# Patient Record
Sex: Male | Born: 1995 | Race: White | Hispanic: No | Marital: Single | Smoking: Current every day smoker
Health system: Southern US, Community
[De-identification: ages and names within clinical notes are randomized; demographics above are authoritative.]

## PROBLEM LIST (undated history)

## (undated) DIAGNOSIS — F329 Major depressive disorder, single episode, unspecified: Secondary | ICD-10-CM

## (undated) DIAGNOSIS — F32A Depression, unspecified: Secondary | ICD-10-CM

## (undated) DIAGNOSIS — F419 Anxiety disorder, unspecified: Secondary | ICD-10-CM

## (undated) DIAGNOSIS — G8929 Other chronic pain: Secondary | ICD-10-CM

## (undated) DIAGNOSIS — M549 Dorsalgia, unspecified: Secondary | ICD-10-CM

## (undated) HISTORY — PX: SURGERY SCROTAL / TESTICULAR: SUR1316

---
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
## 2019-12-20 ENCOUNTER — Other Ambulatory Visit: Payer: Self-pay

## 2019-12-20 ENCOUNTER — Emergency Department (HOSPITAL_COMMUNITY)
Admission: EM | Admit: 2019-12-20 | Discharge: 2019-12-20 | Disposition: A | Discharge status: Home / Self Care | Payer: BC Managed Care – PPO | Attending: Emergency Medicine | Admitting: Emergency Medicine

## 2019-12-20 ENCOUNTER — Encounter (HOSPITAL_COMMUNITY): Payer: Self-pay

## 2019-12-20 ENCOUNTER — Emergency Department (HOSPITAL_COMMUNITY): Payer: BC Managed Care – PPO

## 2019-12-20 DIAGNOSIS — N5089 Other specified disorders of the male genital organs: Secondary | ICD-10-CM

## 2019-12-20 DIAGNOSIS — T7840XA Allergy, unspecified, initial encounter: Secondary | ICD-10-CM | POA: Diagnosis not present

## 2019-12-20 HISTORY — DX: Depression, unspecified: F32.A

## 2019-12-20 HISTORY — DX: Anxiety disorder, unspecified: F41.9

## 2019-12-20 HISTORY — DX: Other chronic pain: G89.29

## 2019-12-20 HISTORY — DX: Dorsalgia, unspecified: M54.9

## 2019-12-20 MED ORDER — DIPHENHYDRAMINE HCL 25 MG PO CAPS
25.0000 mg | ORAL_CAPSULE | Freq: Once | ORAL | Status: AC
  Administered 2019-12-20: 07:00:00 25 mg via ORAL
  Filled 2019-12-20: qty 1

## 2019-12-20 MED ORDER — LORAZEPAM 2 MG/ML IJ SOLN
1.0000 mg | Freq: Once | INTRAMUSCULAR | Status: AC
  Administered 2019-12-20: 08:00:00 1 mg via INTRAVENOUS
  Filled 2019-12-20: qty 1

## 2019-12-20 NOTE — Discharge Instructions (Addendum)
It appears that the swelling and irritation of your scrotum is an allergic response.  For this, use Benadryl 25 mg 4 times a day.  Do not drive or work when taking Benadryl as it can make you sleepy.  Additional care would include daily cleansing with soap and water, and avoiding putting any creams or lotion on the penis or scrotum.  Return here if needed for persistent or worsening symptoms.

## 2019-12-20 NOTE — ED Provider Notes (Signed)
9:20 AM-follow-up after ultrasound imaging.  Ultrasound does show abscess or infection.  It does show scrotal thickening which is consistent with a physical exam.  At this time genital exam, by me, with mild scrotal edema bilaterally.  There is a small abrasion on the ventral surface of the penis.  There is no associated drainage or bleeding or discharge from the penis.  Examination consistent with induration that is most likely inflammatory/allergic, with small abrasion of penis.  No sign for bacterial infection or abscess.  Treatment will be symptomatic at this point with Benadryl, 4 times daily, for at least 2 days.  Caution for driving and working given.   Mancel Bale, MD 12/20/19 [phone]

## 2019-12-20 NOTE — ED Triage Notes (Signed)
Pt states he noticed his scrotum started swelling yesterday. Tried to sleep it off but it was still swollen. Denies any injury.

## 2019-12-20 NOTE — ED Provider Notes (Signed)
[facility] Provider Note   CSN: [id number] Arrival date & time: 12/20/19  2952   History Chief Complaint  Patient presents with  . Groin Swelling    Brent Foster is a 24 y.o. male.  The history is provided by the patient.  He has history of anxiety and comes in complaining of scrotal swelling.  Yesterday, he noted some itching of his scrotum and he did scratch.  He noted some swelling of the scrotal wall which is bilateral.  He continues to have an itching and burning sensation in the scrotum.  He denies any penile swelling.  He denies any unusual exposures.  Past Medical History:  Diagnosis Date  . Anxiety   . Back pain   . Chronic headaches   . Depression     There are no problems to display for this patient.   History reviewed. No pertinent surgical history.     History reviewed. No pertinent family history.  Social History   Tobacco Use  . Smoking status: Never Smoker  . Smokeless tobacco: Never Used  Substance Use Topics  . Alcohol use: Not Currently  . Drug use: Not Currently    Home Medications Prior to Admission medications   Not on File    Allergies    Patient has no active allergies.  Review of Systems   Review of Systems  All other systems reviewed and are negative.   Physical Exam Updated Vital Signs BP 122/69 (BP Location: Right Arm)   Pulse (!) 57   Ht 6\' 1"  (1.854 m)   Wt 63.5 kg   SpO2 99%   BMI 18.47 kg/m   Physical Exam Vitals and nursing note reviewed.   24 year old male, resting comfortably and in no acute distress. Vital signs are normal. Oxygen saturation is 99%, which is normal. Head is normocephalic and atraumatic. PERRLA, EOMI. Oropharynx is clear. Neck is nontender and supple without adenopathy or JVD. Back is nontender and there is no CVA tenderness. Lungs are clear without rales, wheezes, or rhonchi. Chest is nontender. Heart has regular rate and rhythm without murmur. Abdomen is soft,  flat, nontender without masses or hepatosplenomegaly and peristalsis is normoactive. Genitalia: Circumcised penis.  Testes descended.  Moderate induration of the scrotal wall diffusely without any overlying erythema and no fluctuance.  Moderate bilateral inguinal adenopathy present. Extremities have no cyanosis or edema, full range of motion is present. Skin is warm and dry without rash. Neurologic: Mental status is normal, cranial nerves are intact, there are no motor or sensory deficits.  ED Results / Procedures / Treatments    Radiology No results found.  Procedures Procedures  Medications Ordered in ED Medications  diphenhydrAMINE (BENADRYL) capsule 25 mg (has no administration in time range)    ED Course  I have reviewed the triage vital signs and the nursing notes.  Pertinent imaging results that were available during my care of the patient were reviewed by me and considered in my medical decision making (see chart for details).  MDM Rules/Calculators/A&P Swelling of the scrotal wall.  This is most likely allergic, but could be early abscess formation.  He will be sent for scrotal ultrasound.  Old records are reviewed, and he has no relevant past visits.  Case is signed out to Dr. Eulis Foster.  Final Clinical Impression(s) / ED Diagnoses Final diagnoses:  Scrotal swelling    Rx / DC Orders ED Discharge Orders    None       Roxanne Mins,  Onalee Hua, MD 12/20/19 [phone]

## 2020-02-14 ENCOUNTER — Emergency Department (HOSPITAL_COMMUNITY)
Admission: EM | Admit: 2020-02-14 | Discharge: 2020-02-14 | Disposition: A | Discharge status: Home / Self Care | Payer: BC Managed Care – PPO | Attending: Emergency Medicine | Admitting: Emergency Medicine

## 2020-02-14 ENCOUNTER — Other Ambulatory Visit: Payer: Self-pay

## 2020-02-14 DIAGNOSIS — X19XXXA Contact with other heat and hot substances, initial encounter: Secondary | ICD-10-CM | POA: Insufficient documentation

## 2020-02-14 DIAGNOSIS — Y9389 Activity, other specified: Secondary | ICD-10-CM | POA: Diagnosis not present

## 2020-02-14 DIAGNOSIS — Y999 Unspecified external cause status: Secondary | ICD-10-CM | POA: Diagnosis not present

## 2020-02-14 DIAGNOSIS — Y929 Unspecified place or not applicable: Secondary | ICD-10-CM | POA: Insufficient documentation

## 2020-02-14 DIAGNOSIS — T23212A Burn of second degree of left thumb (nail), initial encounter: Secondary | ICD-10-CM | POA: Insufficient documentation

## 2020-02-14 MED ORDER — HYDROCODONE-ACETAMINOPHEN 5-325 MG PO TABS: 1.0000 | ORAL_TABLET | ORAL | 0 refills | Status: AC | PRN

## 2020-02-14 MED ORDER — IBUPROFEN 400 MG PO TABS
400.0000 mg | ORAL_TABLET | Freq: Once | ORAL | Status: AC
  Administered 2020-02-14: 01:00:00 400 mg via ORAL
  Filled 2020-02-14: qty 1

## 2020-02-14 MED ORDER — SILVER SULFADIAZINE 1 % EX CREA
TOPICAL_CREAM | Freq: Once | CUTANEOUS | Status: AC
  Filled 2020-02-14: qty 50
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
## 2020-02-14 NOTE — Discharge Instructions (Signed)
Chang dressing once a day.  Take ibuprofen as needed for pain.  Return if you are having any problems.

## 2020-02-15 MED FILL — Hydrocodone-Acetaminophen Tab 5-325 MG: ORAL | Qty: 6 | Status: AC

## 2020-02-28 ENCOUNTER — Ambulatory Visit
Admission: RE | Admit: 2020-02-28 | Discharge: 2020-02-28 | Disposition: A | Discharge status: Home / Self Care | Payer: BC Managed Care – PPO | Source: Ambulatory Visit | Attending: Emergency Medicine | Admitting: Emergency Medicine

## 2020-02-28 ENCOUNTER — Ambulatory Visit
Admission: EM | Admit: 2020-02-28 | Discharge: 2020-02-28 | Disposition: A | Discharge status: Home / Self Care | Payer: BC Managed Care – PPO | Attending: Emergency Medicine | Admitting: Emergency Medicine

## 2020-02-28 ENCOUNTER — Other Ambulatory Visit: Payer: Self-pay

## 2020-02-28 DIAGNOSIS — N503 Cyst of epididymis: Secondary | ICD-10-CM

## 2020-02-28 DIAGNOSIS — N433 Hydrocele, unspecified: Secondary | ICD-10-CM | POA: Insufficient documentation

## 2020-02-28 DIAGNOSIS — N50811 Right testicular pain: Secondary | ICD-10-CM

## 2020-02-28 NOTE — ED Triage Notes (Signed)
Patient states that he has a lump on his right testicle that has been burning and is like a dull pain. States that he was seen by the ED for this around 2 months and told him it was an allergic reaction to a soap, had a negative [place] at that time.

## 2020-02-28 NOTE — Discharge Instructions (Signed)
Go immediately to the hospital for the ultrasound of your testicle.  Wait there for the results.    Follow-up with a urologist.  Call to schedule an appointment.

## 2020-02-28 NOTE — ED Provider Notes (Signed)
[facility]    CSN: [id number] Arrival date & time: 02/28/20  0941      History   Chief Complaint Chief Complaint  Patient presents with  . Testicle Pain    HPI Brent Foster is a 24 y.o. male.   Patient presents with right testicular painful "lump" and scrotal swelling x 2 months.  He states the pain is "burning, dull" and has increased since last night; he reports no increase in size of the "lump".  He was seen in the ED for these symptoms on 12/20/2019; ultrasound showed "thickened edematous scrotal wall, normal appearing testicles with normal perfusion, benign 1.7 cm pole cyst of the right epididymis, small left hydrocele".  Patient  states he has a history of testicular torsion requiring surgery 4 years ago.  He denies fever, chills, abdominal pain, penile discharge, dysuria, back pain, or other symptoms.  The history is provided by the patient.    Past Medical History:  Diagnosis Date  . Anxiety   . Back pain   . Chronic headaches   . Depression     There are no problems to display for this patient.   History reviewed. No pertinent surgical history.     Home Medications    Prior to Admission medications   Medication Sig Start Date End Date Taking? Authorizing Provider  HYDROcodone-acetaminophen (NORCO) 5-325 MG tablet Take 1 tablet by mouth every 4 (four) hours as needed for moderate pain. 02/14/20   Dione Booze, MD    Family History Family History  Problem Relation Age of Onset  . Healthy Mother   . Healthy Father     Social History Social History   Tobacco Use  . Smoking status: Current Every Day Smoker    Packs/day: 1.00    Types: Cigarettes  . Smokeless tobacco: Never Used  Substance Use Topics  . Alcohol use: Yes    Comment: rare  . Drug use: Yes    Types: Marijuana     Allergies   Patient has no known allergies.   Review of Systems Review of Systems  Constitutional: Negative for chills and fever.  HENT: Negative for ear  pain and sore throat.   Eyes: Negative for pain and visual disturbance.  Respiratory: Negative for cough and shortness of breath.   Cardiovascular: Negative for chest pain and palpitations.  Gastrointestinal: Negative for abdominal pain and vomiting.  Genitourinary: Positive for scrotal swelling and testicular pain. Negative for dysuria, flank pain, hematuria and penile pain.  Musculoskeletal: Negative for arthralgias and back pain.  Skin: Negative for color change and rash.  Neurological: Negative for seizures and syncope.  All other systems reviewed and are negative.    Physical Exam Triage Vital Signs ED Triage Vitals [02/28/20 0943]  Enc Vitals Group     BP      Pulse      Resp      Temp      Temp src      SpO2      Weight 140 lb (63.5 kg)     Height 6\' 1"  (1.854 m)     Head Circumference      Peak Flow      Pain Score 1     Pain Loc      Pain Edu?      Excl. in GC?    No data found.  Updated Vital Signs BP 123/79 (BP Location: Right Arm)   Pulse 62   Temp 98.3 F (36.8 C) (  Oral)   Resp 18   Ht 6\' 1"  (1.854 m)   Wt 140 lb (63.5 kg)   SpO2 98%   BMI 18.47 kg/m   Visual Acuity Right Eye Distance:   Left Eye Distance:   Bilateral Distance:    Right Eye Near:   Left Eye Near:    Bilateral Near:     Physical Exam Vitals and nursing note reviewed.  Constitutional:      Appearance: He is well-developed.  HENT:     Head: Normocephalic and atraumatic.     Mouth/Throat:     Mouth: Mucous membranes are moist.  Eyes:     Conjunctiva/sclera: Conjunctivae normal.  Cardiovascular:     Rate and Rhythm: Normal rate and regular rhythm.     Heart sounds: No murmur.  Pulmonary:     Effort: Pulmonary effort is normal. No respiratory distress.     Breath sounds: Normal breath sounds.  Abdominal:     Palpations: Abdomen is soft.     Tenderness: There is no abdominal tenderness. There is no right CVA tenderness, left CVA tenderness, guarding or rebound.    Genitourinary:    Penis: Normal and circumcised.      Testes: Normal.     Epididymis:     Right: Mass present.    Musculoskeletal:     Cervical back: Neck supple.  Skin:    General: Skin is warm and dry.  Neurological:     General: No focal deficit present.     Mental Status: He is alert and oriented to person, place, and time.     Gait: Gait normal.  Psychiatric:        Mood and Affect: Mood normal.        Behavior: Behavior normal.      UC Treatments / Results  Labs (all labs ordered are listed, but only abnormal results are displayed) Labs Reviewed - No data to display  EKG   Radiology US SCROTUM W/DOPPLER  Result Date: 02/28/2020 CLINICAL DATA:  Right testicular pain for 2 months EXAM: SCROTAL ULTRASOUND DOPPLER ULTRASOUND OF THE TESTICLES TECHNIQUE: Complete ultrasound examination of the testicles, epididymis, and other scrotal structures was performed. Color and spectral Doppler ultrasound were also utilized to evaluate blood flow to the testicles. COMPARISON:  None. FINDINGS: Right testicle Measurements: 4.4 x 2.4 x 3.2 cm. No mass or microlithiasis visualized. Left testicle Measurements: 4.4 x 1.9 x 2.8 cm. No mass or microlithiasis visualized. Right epididymis: There is a 1.5 cm simple cyst in the right epididymis. Left epididymis:  Normal in size and appearance. Hydrocele:  There is a small left hydrocele. Varicocele:  None visualized. Pulsed Doppler interrogation of both testes demonstrates normal low resistance arterial and venous waveforms bilaterally. IMPRESSION: 1. There is a 1.5 cm simple cyst in the right epididymis. 2. There is a small left hydrocele. 3. No other abnormalities are identified. Electronically Signed   By: Dorise Bullion III M.D   On: 02/28/2020 12:20    Procedures Procedures (including critical care time)  Medications Ordered in UC Medications - No data to display  Initial Impression / Assessment and Plan / UC Course  I have reviewed the  triage vital signs and the nursing notes.  Pertinent labs & imaging results that were available during my care of the patient were reviewed by me and considered in my medical decision making (see chart for details).   Right testicular pain and mass. Ultrasound shows 1.5 cm simple cyst in the right  epididymis, small left hydrocele, no other abnormalities.  Discussed [place] results with patient.  Instructed him to call Urology to schedule an appointment for follow up.  Patient agrees to plan of care.      Final Clinical Impressions(s) / UC Diagnoses   Final diagnoses:  Pain in right testicle  Epididymal cyst     Discharge Instructions     Go immediately to the hospital for the ultrasound of your testicle.  Wait there for the results.    Follow-up with a urologist.  Call to schedule an appointment.        ED Prescriptions    None     PDMP not reviewed this encounter.   Mickie Bail, NP 02/28/20 [phone]

## 2020-10-10 ENCOUNTER — Encounter (HOSPITAL_COMMUNITY): Payer: Self-pay | Admitting: *Deleted

## 2020-10-10 ENCOUNTER — Other Ambulatory Visit: Payer: Self-pay

## 2020-10-10 ENCOUNTER — Emergency Department (HOSPITAL_COMMUNITY)
Admission: EM | Admit: 2020-10-10 | Discharge: 2020-10-10 | Discharge status: Against Medical Advice | Payer: BC Managed Care – PPO | Attending: Emergency Medicine | Admitting: Emergency Medicine

## 2020-10-10 DIAGNOSIS — F32A Depression, unspecified: Secondary | ICD-10-CM | POA: Diagnosis not present

## 2020-10-10 DIAGNOSIS — F17210 Nicotine dependence, cigarettes, uncomplicated: Secondary | ICD-10-CM | POA: Diagnosis not present

## 2020-10-10 DIAGNOSIS — Z20822 Contact with and (suspected) exposure to covid-19: Secondary | ICD-10-CM | POA: Diagnosis not present

## 2020-10-10 LAB — RESP PANEL BY RT-PCR (FLU A&B, COVID) ARPGX2
Influenza A by PCR: NEGATIVE
Influenza B by PCR: NEGATIVE
SARS Coronavirus 2 by RT PCR: NEGATIVE

## 2020-10-10 NOTE — ED Provider Notes (Signed)
[facility] EMERGENCY DEPARTMENT Provider Note   CSN: [id number] Arrival date & time: 10/10/20  1725     History Chief Complaint  Patient presents with  . V70.1    Brent Foster is a 24 y.o. male.  HPI Patient presents after being brought in by police.  Per patient he had not made any suicidal statements.  Patient states that he had told his dad that he wanted not to be here anymore.  Per patient states he did not mean that he wanted to die but stated [place].  He states he wanted to get away from his bills.  Does have history depression but states is not on any medicines.  For me denies any suicidal thoughts but apparently had told nurse that he had some suicidal thoughts but no plan.  Patient states he was coming in because he was told he would get a quick video evaluation and then be able to go.  Denies alcohol use.  States he does smoke some marijuana.  Denies hallucinations.    Past Medical History:  Diagnosis Date  . Anxiety   . Back pain   . Chronic headaches   . Depression     There are no problems to display for this patient.   Past Surgical History:  Procedure Laterality Date  . SURGERY SCROTAL / TESTICULAR         Family History  Problem Relation Age of Onset  . Healthy Mother   . Healthy Father     Social History   Tobacco Use  . Smoking status: Current Every Day Smoker    Packs/day: 1.00    Types: Cigarettes  . Smokeless tobacco: Never Used  Vaping Use  . Vaping Use: Never used  Substance Use Topics  . Alcohol use: Not Currently    Comment: rare  . Drug use: Not Currently    Types: Marijuana    Home Medications Prior to Admission medications   Medication Sig Start Date End Date Taking? Authorizing Provider  HYDROcodone-acetaminophen (NORCO) 5-325 MG tablet Take 1 tablet by mouth every 4 (four) hours as needed for moderate pain. 02/14/20   Dione Booze, MD    Allergies    Patient has no known allergies.  Review of  Systems   Review of Systems  Constitutional: Negative for appetite change.  HENT: Negative for congestion.   Respiratory: Negative for shortness of breath.   Cardiovascular: Negative for chest pain.  Gastrointestinal: Negative for abdominal pain.  Genitourinary: Negative for flank pain.  Musculoskeletal: Negative for back pain.  Skin: Negative for pallor.  Neurological: Negative for weakness.  Psychiatric/Behavioral: Positive for dysphoric mood. Negative for suicidal ideas.    Physical Exam Updated Vital Signs BP (!) 117/95 (BP Location: Right Arm)   Pulse 83   Temp 97.8 F (36.6 C) (Oral)   Resp 18   Ht 6\' 1"  (1.854 m)   Wt 63.5 kg   SpO2 97%   BMI 18.47 kg/m   Physical Exam Vitals and nursing note reviewed.  HENT:     Head: Atraumatic.  Eyes:     Pupils: Pupils are equal, round, and reactive to light.  Cardiovascular:     Rate and Rhythm: Regular rhythm.  Pulmonary:     Breath sounds: No wheezing, rhonchi or rales.  Abdominal:     Tenderness: There is no abdominal tenderness.  Musculoskeletal:     Right lower leg: No edema.     Left lower leg:  No edema.  Skin:    General: Skin is warm.     Capillary Refill: Capillary refill takes less than 2 seconds.  Neurological:     Mental Status: He is alert and oriented to person, place, and time.  Psychiatric:        Mood and Affect: Mood normal.        Behavior: Behavior normal.     ED Results / Procedures / Treatments   Labs (all labs ordered are listed, but only abnormal results are displayed) Labs Reviewed  RESP PANEL BY RT-PCR (FLU A&B, COVID) [id number]    EKG None  Radiology No results found.  Procedures Procedures (including critical care time)  Medications Ordered in ED Medications - No data to display  ED Course  I have reviewed the triage vital signs and the nursing notes.  Pertinent labs & imaging results that were available during my care of the patient were reviewed by me and considered in  my medical decision making (see chart for details).    MDM Rules/Calculators/A&P                          Patient sent in by police.  Per patient stated he said he did not want to be here anymore but he meant in the city not suicide.  Potential has some suicidal thoughts but not a plan.  Denies either to me.  Well-appearing.  Patient's refusing blood work.  Do not think I have criteria to IVC him at this time.  However I hope to get a quick TTS evaluation.  If he cannot do this so he will be able to leave patient is medically cleared at this time.  Patient was not willing to stay.  Left AMA.  Do not think he is a risk enough to himself to IVC him. Final Clinical Impression(s) / ED Diagnoses Final diagnoses:  Depression, unspecified depression type    Rx / DC Orders ED Discharge Orders    None       Benjiman Core, MD 10/11/20 [phone]

## 2020-10-10 NOTE — ED Notes (Addendum)
Dr. Rubin Payor notified of PTs desire to leave prior to completing TTS eval. Dr. Rubin Payor declined to IVC Pt at this time and states "Pt needs to have TTS before IVS".  PT given his personal belongings and escorted to bathroom to change from scrubs back into his street clothes. Pt denies SI or HI and states "I was only wanting to talk with a counselor, I didn't want to cause a scene".  PT was educated on leaving AMA and the risk associated with not following Dr Arlington Calix plan of care. PT was calm and cooperative, prior to self ambulating with steady gait to exit. Pt had NO IV.

## 2020-10-10 NOTE — ED Triage Notes (Signed)
Cops called on pt, pt had stated he didn't want to be here anymore.  Pt admits to SI but denies a plan.  Pt denies ETOH or drug use.

## 2022-02-06 IMAGING — US US SCROTUM W/ DOPPLER COMPLETE
2 series · 13 of 25 positions shown · non-contrast
Comparison: None.

CLINICAL DATA: Scrotal edema.

EXAM:
SCROTAL ULTRASOUND
DOPPLER ULTRASOUND OF THE TESTICLES
TECHNIQUE: Complete ultrasound examination of the testicles, epididymis, and
other scrotal structures was performed. Color and spectral Doppler
ultrasound were also utilized to evaluate blood flow to the
testicles.

[Series 1: us scrotum w/ doppler complete · 5 of 23 slices shown (1 of 2)]
[im 1/23]
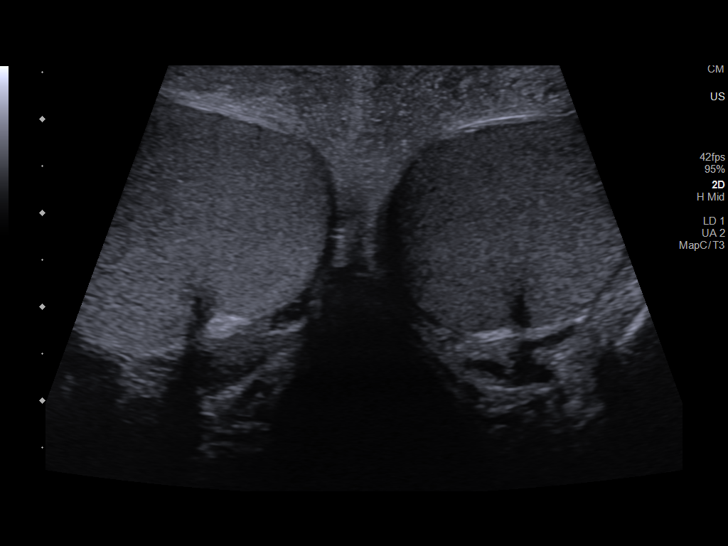
[im 6/23]
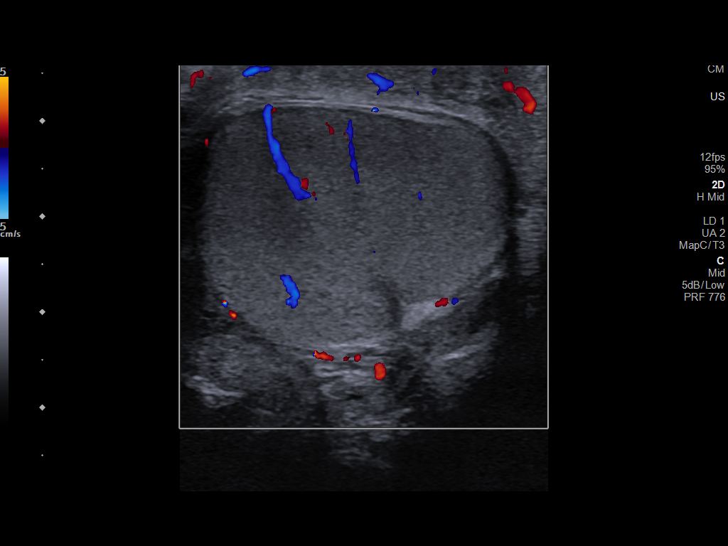
[im 12/23]
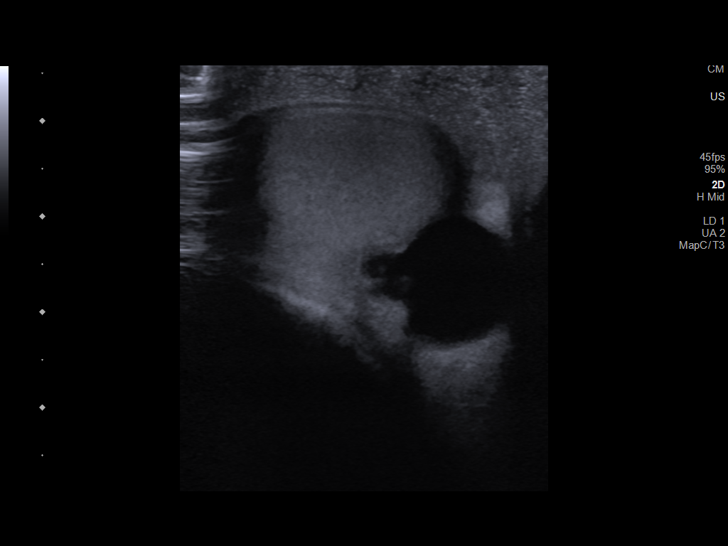
[im 17/23]
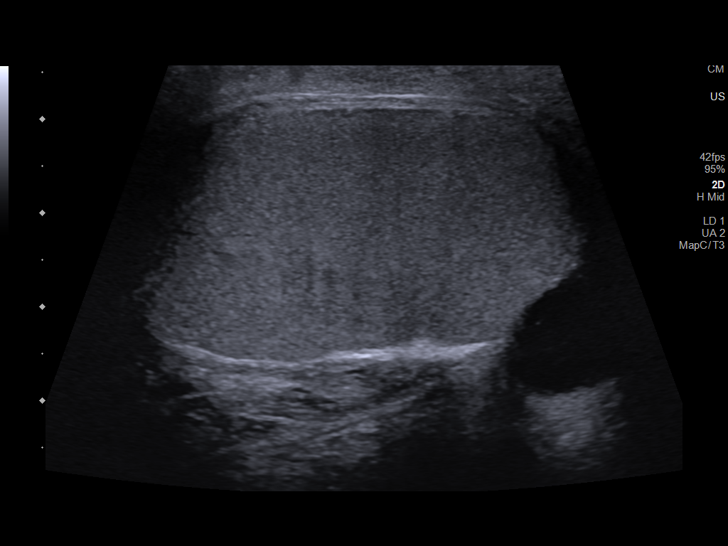
[im 23/23]
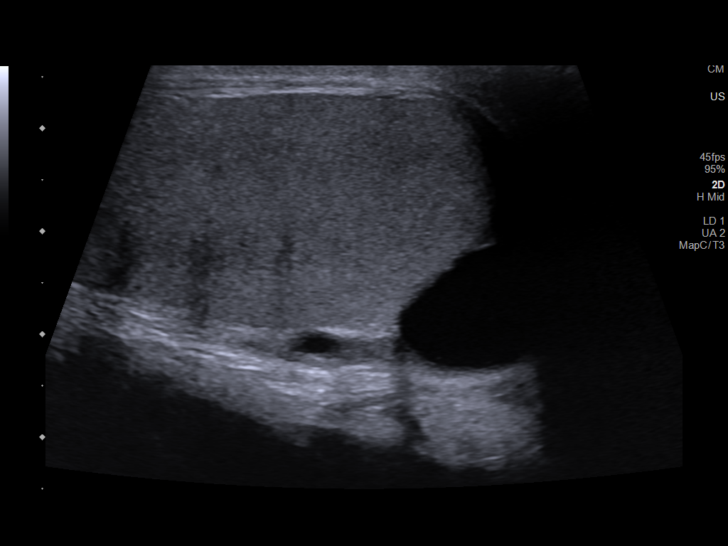

[Series 2: us scrotum w/ doppler complete · 8 of 40 slices shown (2 of 2)]
[im 3/40]
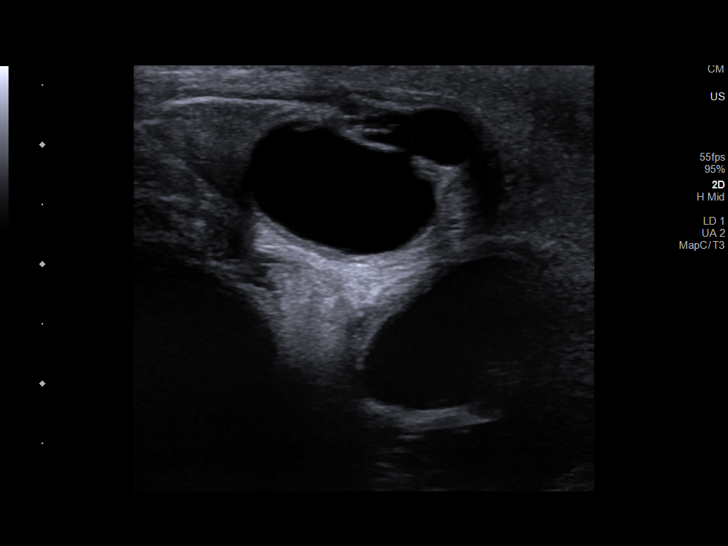
[im 8/40]
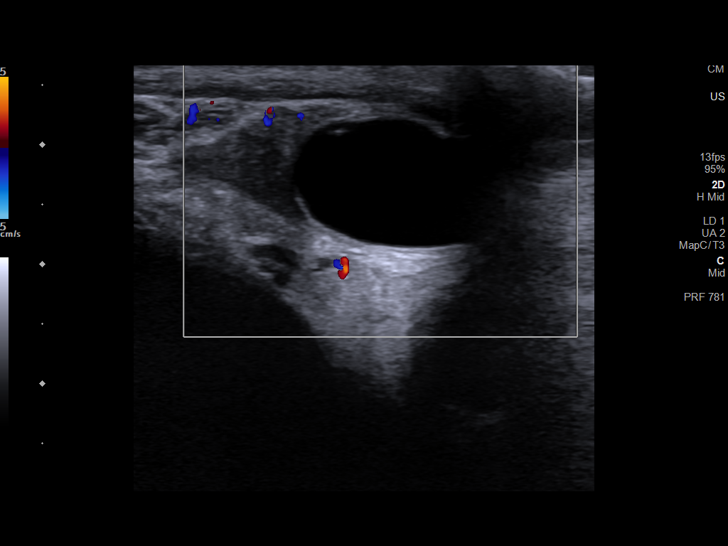
[im 14/40]
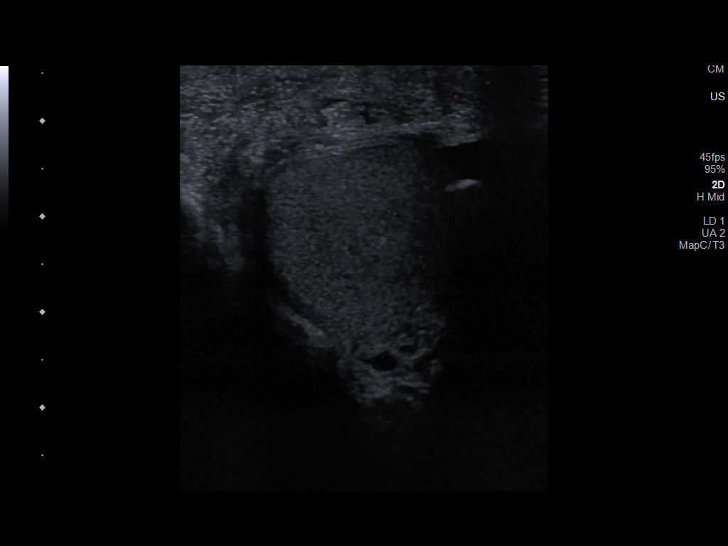
[im 19/40]
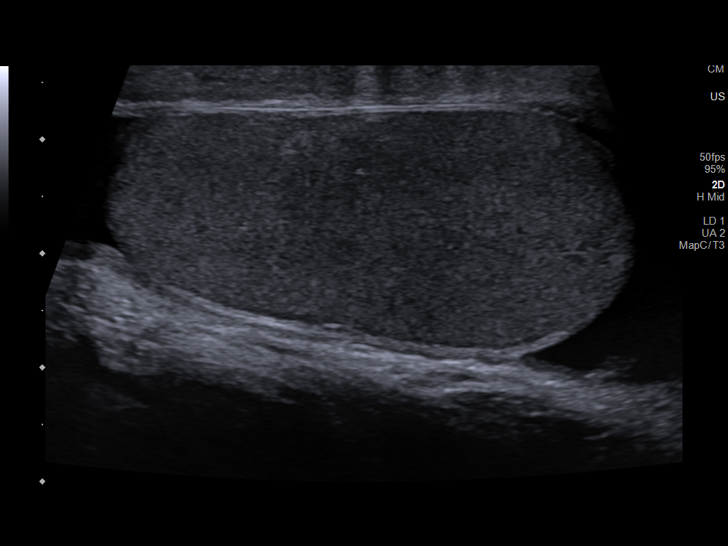
[im 24/40]
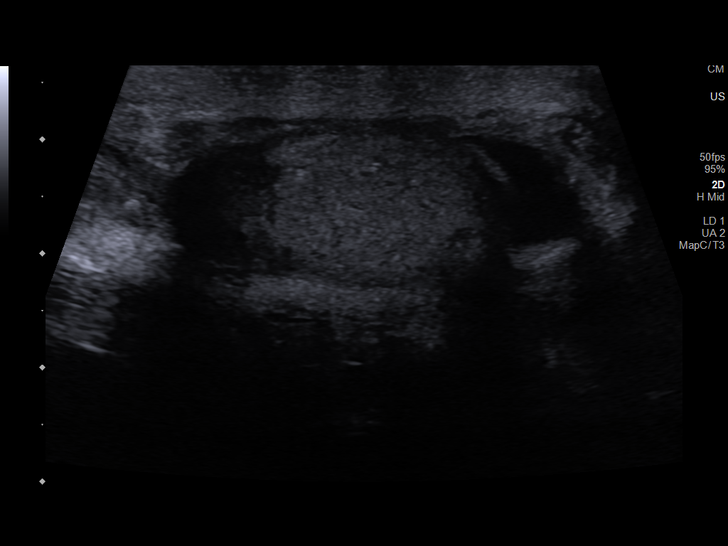
[im 29/40]
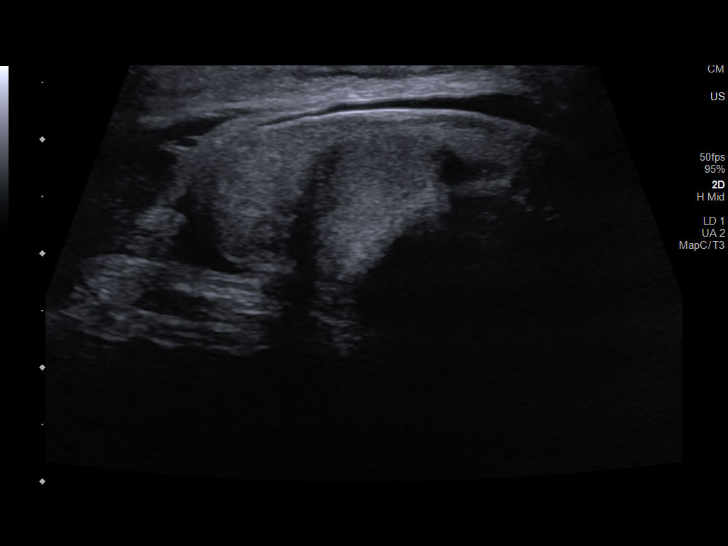
[im 34/40]
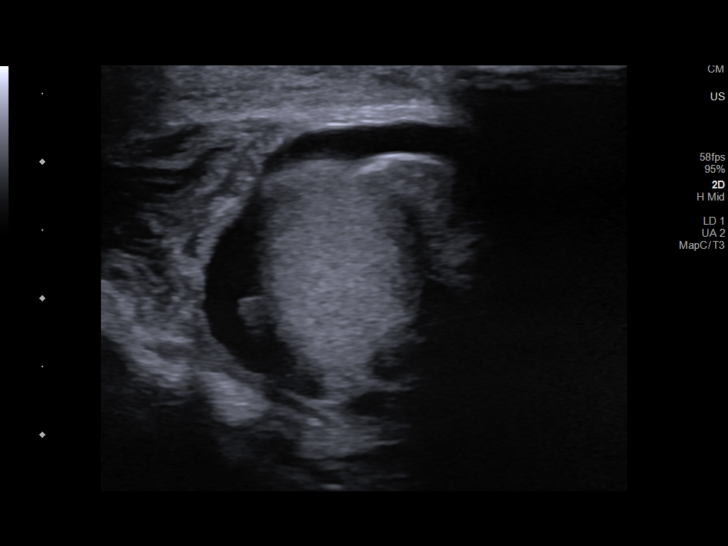
[im 40/40]
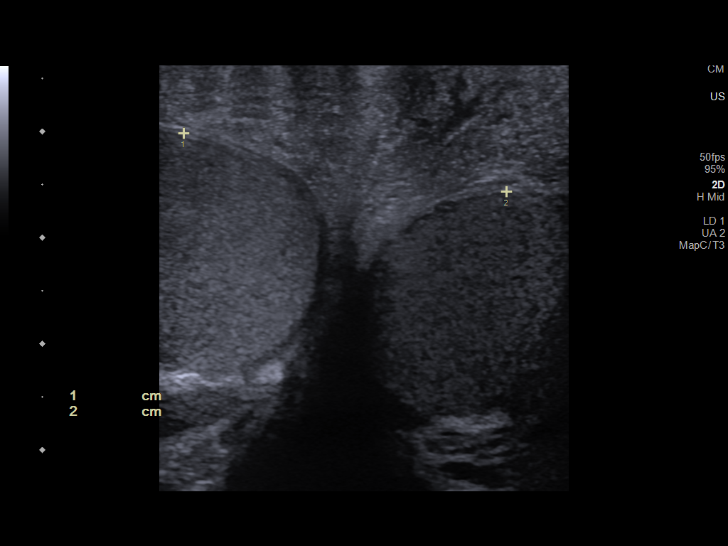

[13 of 25 positions shown; findings below may reference images not displayed]

FINDINGS: Right testicle

Measurements: 4.6 x 2.8 x 3.2 cm. No mass or microlithiasis
visualized.

Left testicle

Measurements: 4.7 x 2.0 x 2.8 cm. No mass or microlithiasis
visualized.

Right epididymis: There is 1.7 x 1.5 x 1.0 cm simple cyst in the
right epididymis. The epididymis is otherwise normal.

Left epididymis:  Normal.

Hydrocele:  None visualized.

Varicocele:  Small left hydrocele.

Pulsed Doppler interrogation of both testes demonstrates normal low
resistance arterial and venous waveforms bilaterally.

The scrotal wall is thickened and edematous.
IMPRESSION: 1. Thickened edematous scrotal wall.
2. Normal appearing testicles with normal perfusion.
3. Benign 1.7 cm simple cyst of the right epididymis.
4. Small left hydrocele.
